# Patient Record
Sex: Female | Born: 1955 | Race: White | Hispanic: No
Health system: Southern US, Community
[De-identification: ages and names within clinical notes are randomized; demographics above are authoritative.]

---

## 2016-07-14 ENCOUNTER — Other Ambulatory Visit: Payer: Self-pay | Admitting: Occupational Medicine

## 2016-07-14 ENCOUNTER — Ambulatory Visit: Payer: Self-pay

## 2016-07-14 DIAGNOSIS — M25561 Pain in right knee: Secondary | ICD-10-CM

## 2017-01-23 IMAGING — CR DG KNEE COMPLETE 4+V*R*
4 series · 4 of 4 positions shown · non-contrast
Comparison: No priors.

CLINICAL DATA: 60-year-old female with history of trauma from a
fall today complaining of anterior right knee pain.

EXAM:
RIGHT KNEE - COMPLETE 4+ VIEW

[view not recorded (1 of 4)]
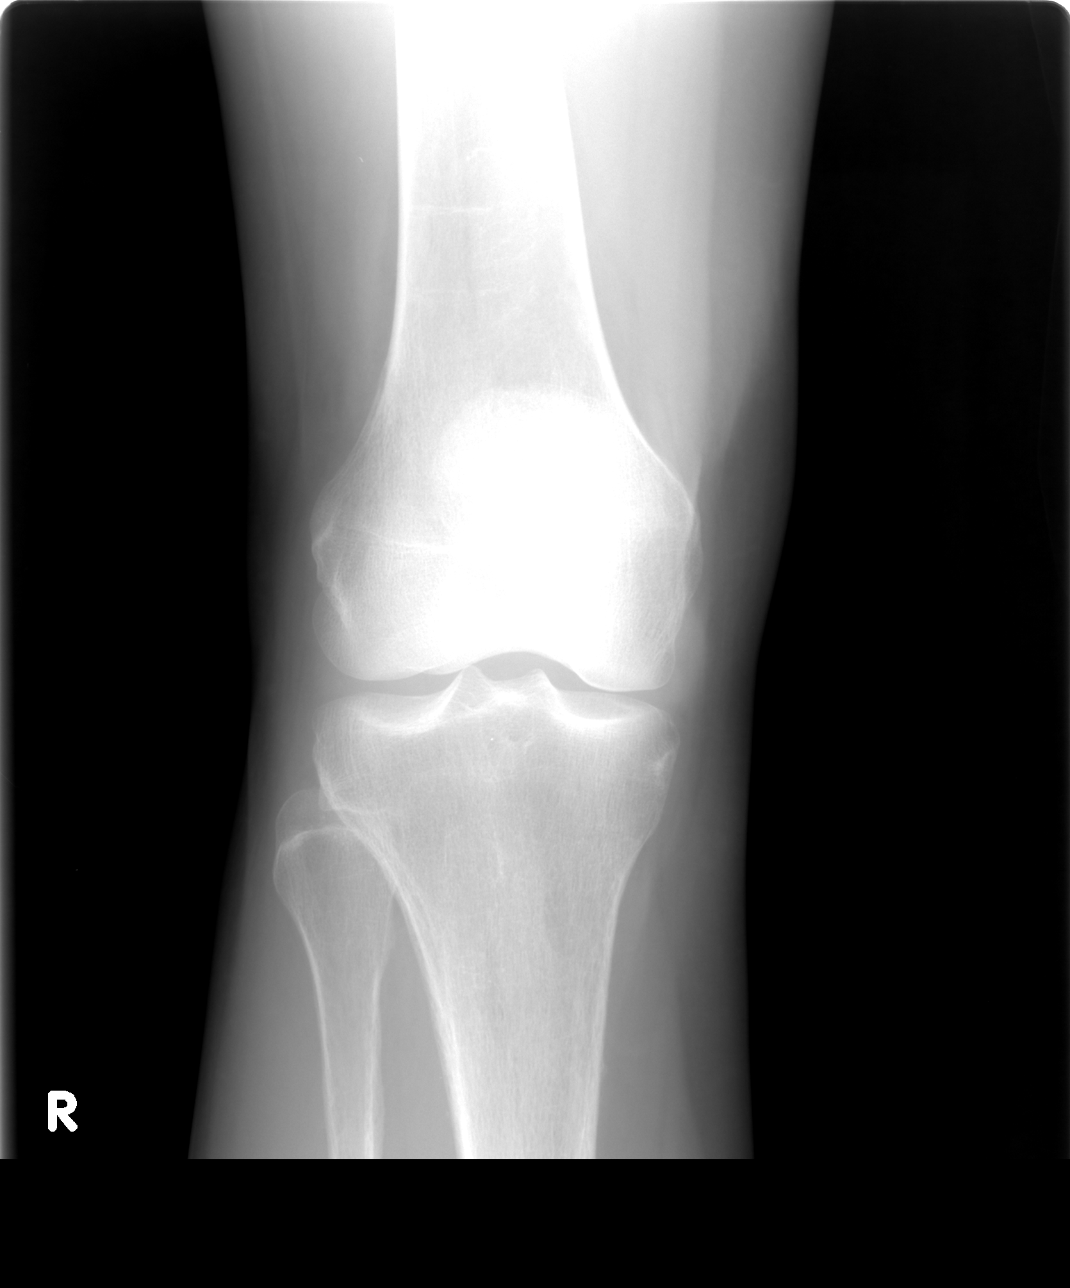

[view not recorded (2 of 4)]
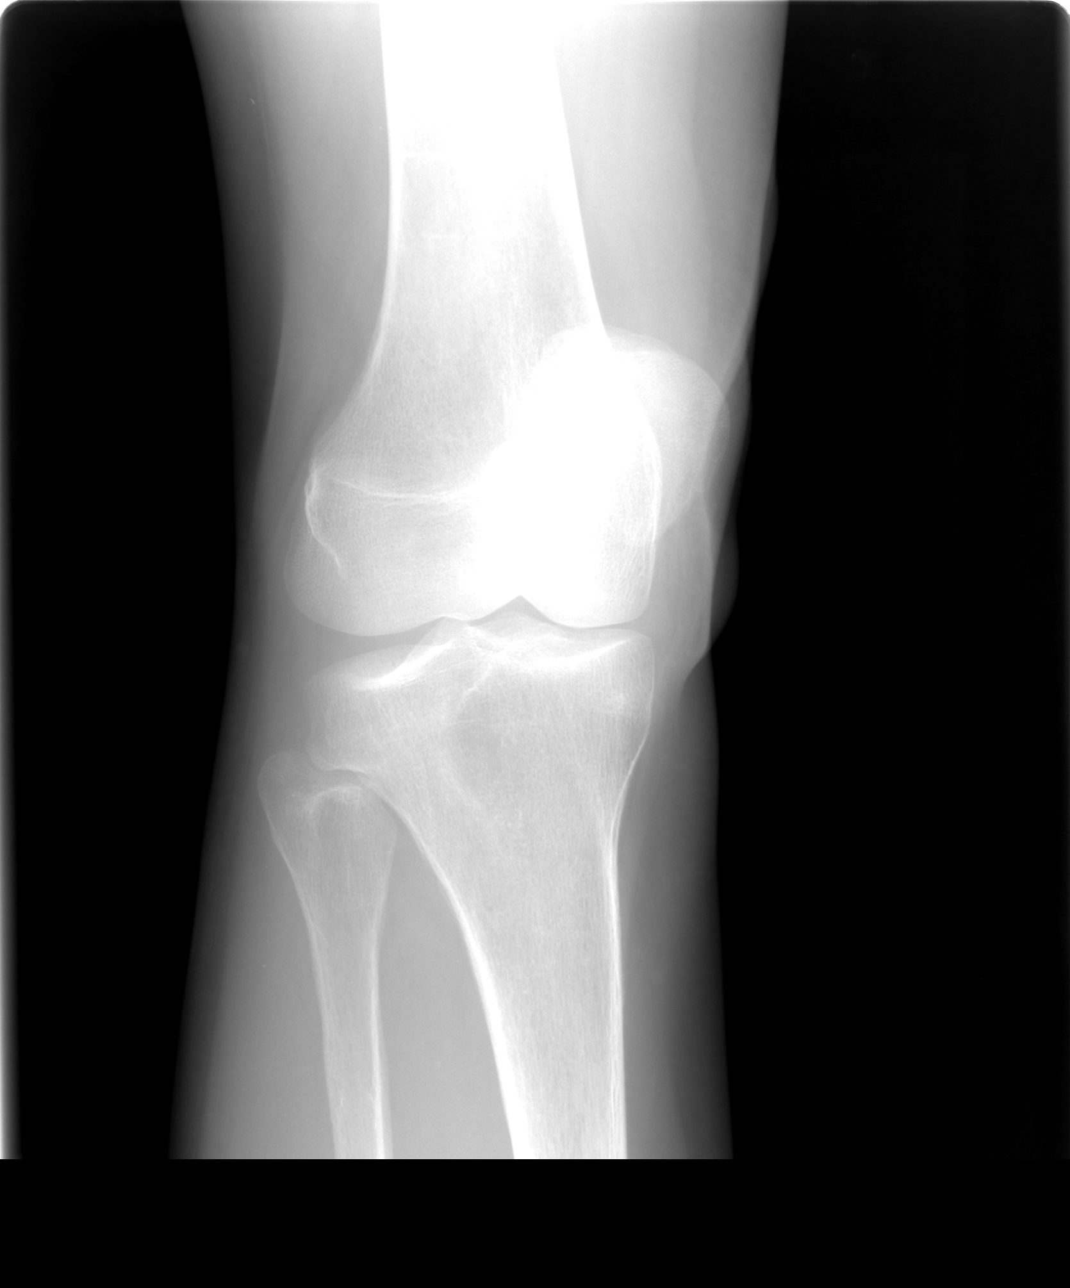

[view not recorded (3 of 4)]
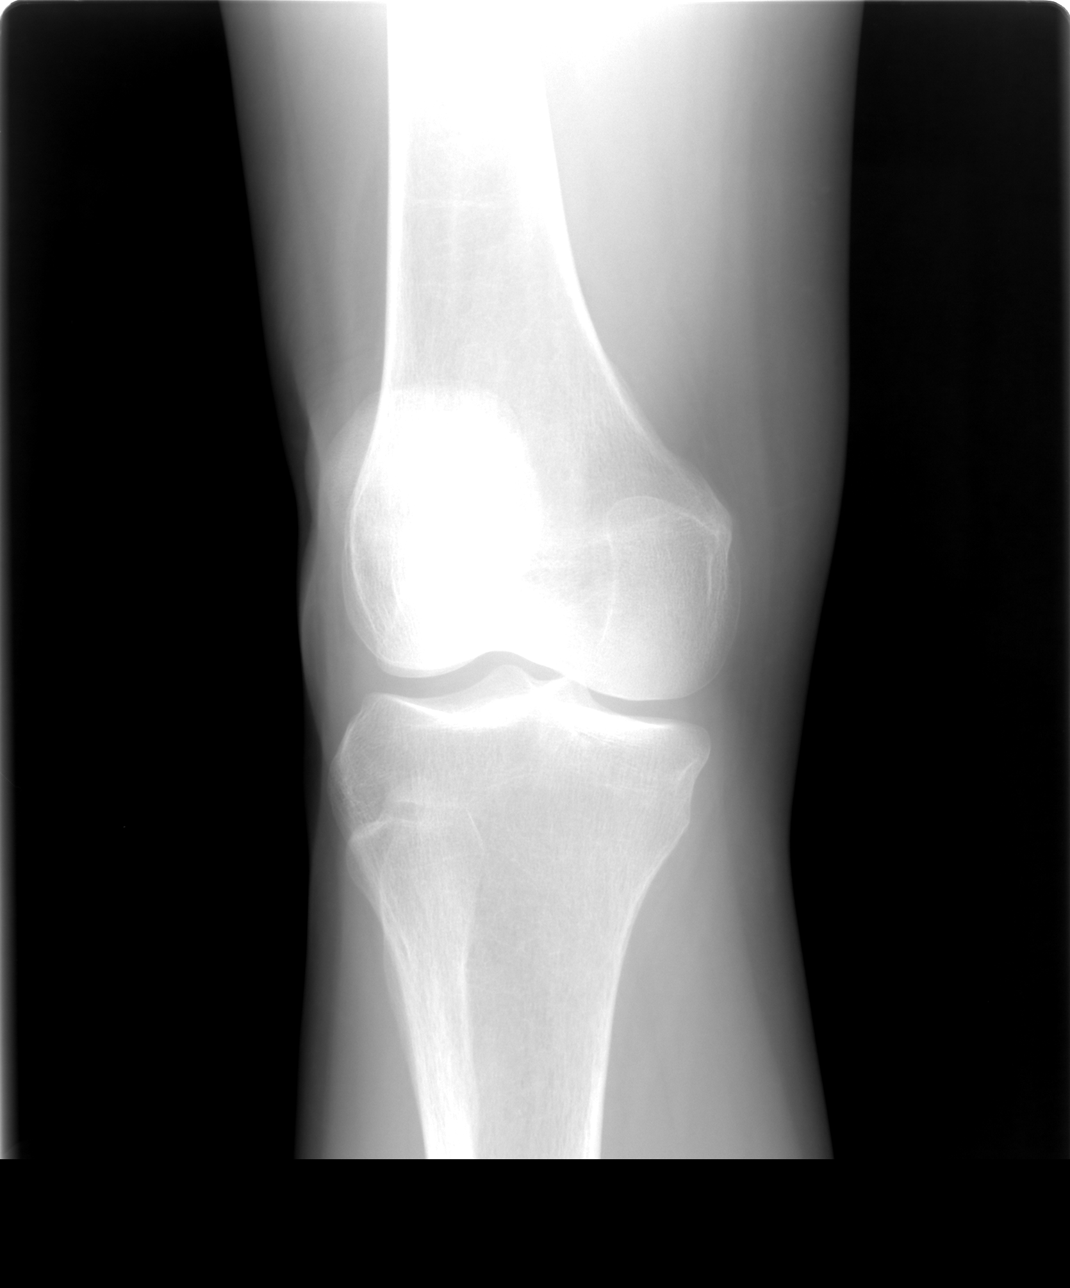

[view not recorded (4 of 4)]
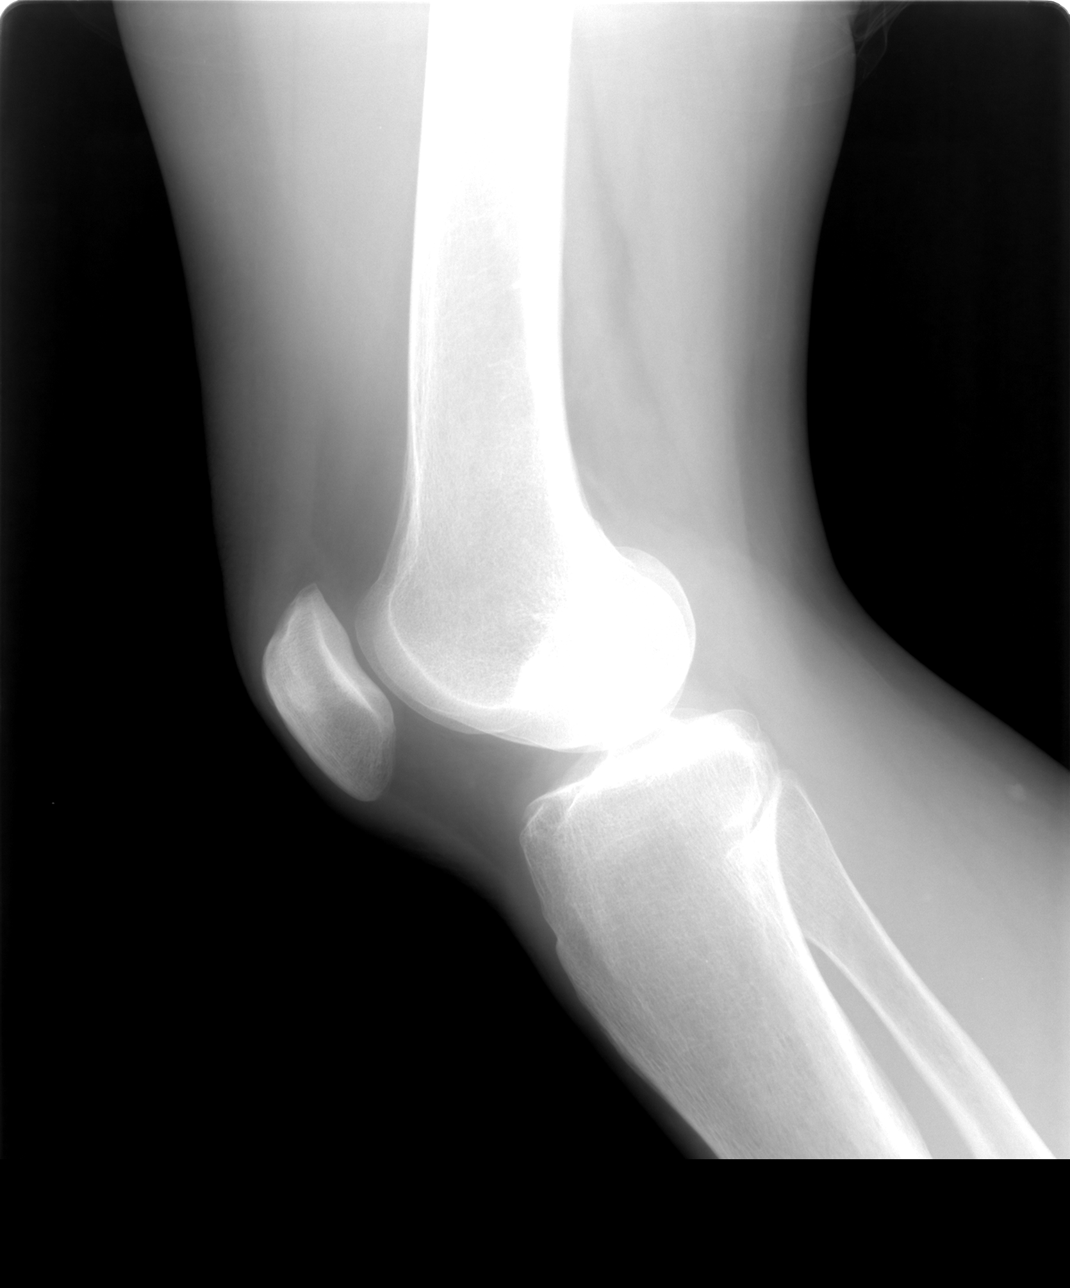

[4 of 4 positions shown; findings below may reference images not displayed]

FINDINGS: Multiple views of the right knee demonstrate no acute displaced
fracture, subluxation, dislocation, or soft tissue abnormality.
IMPRESSION: No acute radiographic abnormality of the right knee.

## 2020-03-22 ENCOUNTER — Ambulatory Visit: Payer: Self-pay | Attending: Internal Medicine

## 2020-03-22 DIAGNOSIS — Z23 Encounter for immunization: Secondary | ICD-10-CM

## 2020-03-22 NOTE — Progress Notes (Signed)
   Covid-19 Vaccination Clinic  Name:  [name]    MRN: [id number] DOB: 1956/02/04  03/22/2020  [name] was observed post Covid-19 immunization for 15 minutes without incident. She was provided with Vaccine Information Sheet and instruction to access the V-Safe system.   [name] was instructed to call 911 with any severe reactions post vaccine: Marland Kitchen Difficulty breathing  . Swelling of face and throat  . A fast heartbeat  . A bad rash all over body  . Dizziness and weakness   Immunizations Administered    Name Date Dose VIS Date Route   Pfizer COVID-19 Vaccine 03/22/2020  8:20 AM 0.3 mL 12/02/2019 Intramuscular   Manufacturer: [employer], [employer]   Lot: [id number]   NDC: [address]-5

## 2020-04-16 ENCOUNTER — Ambulatory Visit: Payer: Self-pay | Attending: Internal Medicine

## 2020-04-16 DIAGNOSIS — Z23 Encounter for immunization: Secondary | ICD-10-CM

## 2020-04-16 NOTE — Progress Notes (Signed)
   Covid-19 Vaccination Clinic  Name:  Stephanie Porter    MRN: [id number] DOB: [birth year]  04/16/2020  [name] was observed post Covid-19 immunization for 15 minutes without incident. She was provided with Vaccine Information Sheet and instruction to access the V-Safe system.   [name] was instructed to call 911 with any severe reactions post vaccine: Marland Kitchen Difficulty breathing  . Swelling of face and throat  . A fast heartbeat  . A bad rash all over body  . Dizziness and weakness   Immunizations Administered    Name Date Dose VIS Date Route   Pfizer COVID-19 Vaccine 04/16/2020  8:10 AM 0.3 mL 02/15/2019 Intramuscular   Manufacturer: [employer], [employer]   Lot: [id number]   NDC: [address]-6
